# Patient Record
Sex: Male | Born: 2000
Health system: Southern US, Community
[De-identification: ages and names within clinical notes are randomized; demographics above are authoritative.]

## PROBLEM LIST (undated history)

## (undated) ENCOUNTER — Emergency Department (HOSPITAL_COMMUNITY): Admission: EM | Payer: Medicaid Other | Source: Home / Self Care

## (undated) DIAGNOSIS — J45909 Unspecified asthma, uncomplicated: Secondary | ICD-10-CM

## (undated) DIAGNOSIS — K047 Periapical abscess without sinus: Secondary | ICD-10-CM

## (undated) DIAGNOSIS — F909 Attention-deficit hyperactivity disorder, unspecified type: Secondary | ICD-10-CM

## (undated) DIAGNOSIS — T7840XA Allergy, unspecified, initial encounter: Secondary | ICD-10-CM

## (undated) HISTORY — DX: Periapical abscess without sinus: K04.7

## (undated) HISTORY — DX: Allergy, unspecified, initial encounter: T78.40XA

## (undated) HISTORY — PX: MOUTH SURGERY: SHX715

## (undated) HISTORY — DX: Unspecified asthma, uncomplicated: J45.909

## (undated) HISTORY — DX: Attention-deficit hyperactivity disorder, unspecified type: F90.9

---
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
## 2023-11-14 DIAGNOSIS — Z113 Encounter for screening for infections with a predominantly sexual mode of transmission: Secondary | ICD-10-CM | POA: Diagnosis not present

## 2023-11-19 ENCOUNTER — Telehealth: Payer: Self-pay | Admitting: Medical

## 2023-11-19 ENCOUNTER — Ambulatory Visit (INDEPENDENT_AMBULATORY_CARE_PROVIDER_SITE_OTHER): Payer: BC Managed Care – PPO | Admitting: Medical

## 2023-11-19 VITALS — BP 130/80 | HR 94 | Ht 73.0 in | Wt 179.2 lb

## 2023-11-19 DIAGNOSIS — F909 Attention-deficit hyperactivity disorder, unspecified type: Secondary | ICD-10-CM | POA: Diagnosis not present

## 2023-11-19 DIAGNOSIS — Z202 Contact with and (suspected) exposure to infections with a predominantly sexual mode of transmission: Secondary | ICD-10-CM

## 2023-11-19 DIAGNOSIS — R21 Rash and other nonspecific skin eruption: Secondary | ICD-10-CM

## 2023-11-19 DIAGNOSIS — R369 Urethral discharge, unspecified: Secondary | ICD-10-CM | POA: Diagnosis not present

## 2023-11-19 LAB — POCT URINALYSIS DIP (PROADVANTAGE DEVICE)
Bilirubin, UA: NEGATIVE
Blood, UA: NEGATIVE
Glucose, UA: NEGATIVE mg/dL
Leukocytes, UA: NEGATIVE
Nitrite, UA: NEGATIVE
Protein Ur, POC: NEGATIVE mg/dL
Specific Gravity, Urine: 1.02
Urobilinogen, Ur: NEGATIVE
pH, UA: 7 (ref 5.0–8.0)

## 2023-11-19 MED ORDER — VALACYCLOVIR HCL 1 G PO TABS: 1000.0000 mg | ORAL_TABLET | Freq: Two times a day (BID) | ORAL | 0 refills | Status: AC

## 2023-11-19 MED ORDER — CEFTRIAXONE SODIUM 500 MG IJ SOLR
500.0000 mg | Freq: Once | INTRAMUSCULAR | Status: AC
  Administered 2023-11-19: 500 mg via INTRAMUSCULAR

## 2023-11-19 MED ORDER — DEXTROAMPHETAMINE SULFATE 10 MG PO TABS: 10.0000 mg | ORAL_TABLET | Freq: Every day | ORAL | 0 refills | Status: AC

## 2023-11-19 MED ORDER — AZITHROMYCIN 500 MG PO TABS: 1000.0000 mg | ORAL_TABLET | Freq: Once | ORAL | 0 refills | Status: AC

## 2023-11-19 NOTE — Telephone Encounter (Signed)
Mom left message asking for refill albuterol inhaler to [facility]

## 2023-11-19 NOTE — Progress Notes (Signed)
Subjective:  Mark Solis is a 22 y.o. male who presents for Chief Complaint  Patient presents with   STD check    STD check- has bumps on shaft area. Painful. Last Encourse was Tuesday. Partner did STI screening and was negative Went to school clinic in [place] on Tuesday and was told his urine and discharge was too thick to run a test. Did a herpes swab but not sure of outcome but gotten worse since Tuesday    Medication Refill    Needs a refill on his ADHD medication too.      Here for concern for STD.  He has symptoms that started last week.  Last sexual encounter was a week ago.  He has had penile discharge, greenish at times, he has had bumps on the penis, urine does not stay at times.  No urgency.  No burning with urination.  No testicle swelling or pain.  No fever body aches or chills.  His partner does not have any symptoms.  He does note history of chlamydia about a year ago.  He had HIV and other blood testing in about 2 months ago that was negative.  He went to clinic at the school a week ago and had testing but results have either not come back for therapy swab or the chlamydia test could not be run on the urine  Here to have swabs to check for gonorrhea and chlamydia.  The rash has been itchy.  They did swab the rash area on his penis shaft for herpes.  He has not got the results back yet.  He would like a refill on his ADHD medication.   He has been using his medicine periodically not every day.  His attention has been a little distracted of late but he uses his medicine more frequently with testing.  even lately playing basketball he gets distracted fairly easily.  No other aggravating or relieving factors.    No other c/o.  Past Medical History:  Diagnosis Date   ADHD    Allergy    Asthma    Dental abscess 2010   Current Outpatient Medications on File Prior to Visit  Medication Sig Dispense Refill   albuterol (VENTOLIN HFA) 108 (90 Base) MCG/ACT  inhaler Inhale 2 puffs into the lungs every 6 (six) hours as needed for wheezing or shortness of breath. 18 g 2   dextroamphetamine (DEXTROSTAT) 10 MG tablet Take 1 tablet (10 mg total) by mouth daily. 90 tablet 0   No current facility-administered medications on file prior to visit.     The following portions of the patient's history were reviewed and updated as appropriate: allergies, current medications, past family history, past medical history, past social history, past surgical history and problem list.  ROS Otherwise as in subjective above     Objective: BP 130/80   Pulse 94   Ht 6\' 1"  (1.854 m)   Wt 179 lb 3.2 oz (81.3 kg)   BMI 23.64 kg/m   General appearance: alert, no distress, well developed, well nourished GU: There are several areas of ulceration on the dorsal and left lateral base of the penis, some seem to be pinkish-red with some serous oozing, there are a few scabbed areas as well.  There are a few vesicles in this area.  There are some smaller scabbed lesions on the distal right shaft of the penis.  Testicles nontender with no swelling.  There is inguinal lymphadenopathy present.  No obvious canker sore.  There is a whitish clear discharge coming out of the head of the penis Psych: Pleasant, answers questions appropriately   Assessment: Encounter Diagnoses  Name Primary?   Penile discharge Yes   Venereal disease contact    Penile rash    Attention deficit hyperactivity disorder (ADHD), unspecified ADHD type      Plan: Penile discharge, venereal disease contact, penile rash:  Given your symptoms and exam findings today I recommend treatment empirically with the likelihood that there is an STD.  We gave you a shot of Rocephin 500 mg today to cover for gonorrhea  Begin azithromycin 2 tablets today to help treat chlamydia  I also sent Valtrex to your pharmacy.  Begin 1 tablet twice a day for a week for the potential for herpes.  Check back in with your  school clinic to find out the results of the herpes swab  We will report back: Your results are in from today.  Avoid sexual activity until this is all healed up.  I recommend no sexual activity for the next 2 weeks.  Use condoms.  I do recommend general STD repeat testing in the next 1 to 3 months   ADHD -continue dextroamphetamine 10 mg daily.  Advised to use it more regularly instead of sporadically since he is having more problems with distraction and inattention lately   [name] was seen today for std check and medication refill.  Diagnoses and all orders for this visit:  Penile discharge -     Chlamydia/Gonococcus/Trichomonas, NAA -     RPR -     POCT Urinalysis DIP (Proadvantage Device) -     cefTRIAXone (ROCEPHIN) injection 500 mg  Venereal disease contact -     Chlamydia/Gonococcus/Trichomonas, NAA -     RPR -     POCT Urinalysis DIP (Proadvantage Device) -     cefTRIAXone (ROCEPHIN) injection 500 mg  Penile rash -     Chlamydia/Gonococcus/Trichomonas, NAA -     RPR -     POCT Urinalysis DIP (Proadvantage Device) -     cefTRIAXone (ROCEPHIN) injection 500 mg  Attention deficit hyperactivity disorder (ADHD), unspecified ADHD type  Other orders -     azithromycin (ZITHROMAX) 500 MG tablet; Take 2 tablets (1,000 mg total) by mouth once for 1 dose. -     valACYclovir (VALTREX) 1000 MG tablet; Take 1 tablet (1,000 mg total) by mouth 2 (two) times daily for 7 days. -     dextroamphetamine (DEXTROSTAT) 10 MG tablet; Take 1 tablet (10 mg total) by mouth daily.   Follow up: pending labs

## 2023-11-19 NOTE — Patient Instructions (Signed)
Given your symptoms and exam findings today I recommend treatment empirically with the likelihood that there is an STD.  We gave you a shot of Rocephin 500 mg today to cover for gonorrhea  Begin azithromycin 2 tablets today to help treat chlamydia  I also sent Valtrex to your pharmacy.  Begin 1 tablet twice a day for a week for the potential for herpes.  Check back in with your school clinic to find out the results of the herpes swab  We will report back: Your results are in from today.  Avoid sexual activity until this is all healed up.  I recommend no sexual activity for the next 2 weeks.  Use condoms.  I do recommend general STD repeat testing in the next 1 to 3 months    Genital Herpes Genital herpes is a common sexually transmitted infection (STI) that is caused by a virus. The virus spreads from person to person through contact with a sore, infected saliva, or infected skin. The virus can cause itching, blisters, and sores around the genitals or rectum. During an outbreak of infection, symptoms may last for several days and then go away. However, the virus remains in the body, so more outbreaks may happen in the future. The time between outbreaks varies and can be from months to years. Genital herpes can affect anyone. It is particularly concerning for pregnant women because the virus can be passed to the baby during delivery. Genital herpes is also a concern for people who have a weak disease-fighting system (immune system). What are the causes? This condition is caused by the herpes simplex virus, type 1 or type 2 (HSV-1 or HSV-2). The virus may spread through: Sexual contact with an infected person, including vaginal, anal, and oral sex. Contact with a herpes sore. The skin. This means that you can get herpes from an infected partner even if there are no blisters or sores present. Your partner may not know that he or she is infected. What increases the risk? You are more likely to  develop this condition if: You have sex with many partners. You do not use latex or polyurethane condoms during sex. What are the signs or symptoms? Most people do not have symptoms or they have mild symptoms that may be mistaken for other skin problems. Symptoms may include: Small, red bumps near the genitals, rectum, or mouth. These bumps turn into blisters and then sores. Flu-like (influenza-like) symptoms, including: Fever. Body aches. Swollen lymph nodes. Headache. Painful urination. Pain and itching in the genital area or rectal area. Vaginal discharge. Tingling or shooting pain in the legs and buttocks. Generally, symptoms are more severe and last longer during the first (primary) outbreak. Influenza-like symptoms are also more common during the primary outbreak. How is this diagnosed? This condition may be diagnosed based on: A physical exam. Your medical history. Blood tests. A test of a fluid sample (culture) from an open sore. How is this treated? There is no cure for this condition, but treatment with antiviral medicines can do the following: Speed up healing and relieve symptoms. Help to reduce the spread of the virus to sexual partners. Limit the chance of future outbreaks, or make future outbreaks shorter. Lessen symptoms of future outbreaks. Your health care provider may also recommend over-the-counter medicines to help with pain and itching. Follow these instructions at home: If you have an outbreak:  Keep the affected areas dry and clean. Avoid rubbing or touching blisters and sores. If you do touch  blisters or sores: Wash your hands thoroughly with soap and water for at least 20 seconds. If soap and water are not available, use an alcohol-based hand sanitizer. Do not touch your eyes afterward. Sexual activity Do not have sexual contact during active outbreaks. Practice safe sex. Herpes can spread even if your partner does not have blisters or sores. Latex or  polyurethane condoms and male condoms may help prevent the spread of the herpes virus. Managing pain and discomfort If directed, put ice on the painful area. To do this: Put ice in a plastic bag. Place a towel between your skin and the bag. Leave the ice on for 20 minutes, 2-3 times a day. Remove the ice if your skin turns bright red. This is very important. If you cannot feel pain, heat, or cold, you have a greater risk of damage to the area. If told, take a cool sitz bath to help relieve pain or itching. A sitz bath is a water bath that you take while sitting down in water that is deep enough to cover your hips and buttocks. General instructions Take over-the-counter and prescription medicines only as told by your health care provider. If you were prescribed an antiviral medicine, use it as told by your health care provider. Do not stop using the antiviral even if you start to feel better. Keep all follow-up visits. This is important. How is this prevented? Use condoms. Although you can get genital herpes during sexual contact even with the use of a condom, a condom can provide some protection. Avoid having multiple sexual partners. Talk with your sexual partner about any symptoms either of you may have. Also, talk with your partner about any history of STIs. Do not have sexual contact if you have active symptoms of genital herpes. Contact a health care provider if: Your symptoms are not improving with medicine. Your symptoms return, or you have new symptoms. You have a fever. You have abdominal pain. You have redness, swelling, or pain in your eye. You notice new sores on other parts of your body. You have had herpes and you become pregnant or plan to become pregnant. Get help right away if: You have symptoms of viral meningitis. This is Gonorrhea Gonorrhea is a sexually transmitted infection (STI) that can infect any person. If left untreated, this infection can: Damage the  reproductive organs. Spread to other parts of the body. Cause someone to be unable to have children (infertility). Harm an unborn baby if an infected person is pregnant. It is important to get treatment for gonorrhea as soon as possible. All of your sex partners may also need to be treated for the infection. What are the causes? This condition is caused by bacteria called Neisseria gonorrhoeae. The infection is spread from person to person through sexual contact, including oral, anal, and vaginal sex. The infection can also pass from a pregnant person to the baby during birth. What increases the risk? The following factors may make you more likely to develop this condition: Being a woman younger than 25 years and sexually active. Being a man who has sex with men. Having a new sex partner or having multiple partners. Having a sex partner who has an STI. Not using condoms correctly or not using condoms every time you have sex. Having a history of STIs. What are the signs or symptoms? When symptoms occur, they may include: Abnormal discharge from the penis or vagina. The discharge may be cloudy, thick, or yellow-green in color. Pain  or burning when you urinate. Itching, irritation, pain, bleeding, or discharge from the rectum. This may occur if the infection was spread by anal sex. Sore throat or swollen lymph nodes in the neck. This may occur if the infection was spread by oral sex. Pain or swelling in the testicles. Bleeding between menstrual periods. If the infection has spread to other areas of the body, symptoms may include: Fever. Eye irritation. Swelling, redness, warmth, and pain in the joints. Rashes. In some cases, there are no symptoms. How is this diagnosed? This condition is diagnosed based on: A physical exam. A swab of fluid. The swab of fluid may be taken from the penis, vagina, throat, or rectum. Urine tests. Not all test results will be available during your  visit. How is this treated? This condition is treated with antibiotic medicines. It is important to start treatment as soon as possible. Early treatment may prevent some problems from developing. You should not have sex during treatment. All types of sexual activity should be avoided for at least 7 days after treatment is complete and until your sex partner or partners have been treated. Follow these instructions at home: Take over-the-counter and prescription medicines as told by your health care provider. Finish all antibiotic medicine even when you start to feel better. Do not have sex during treatment. Do not have sex until at least 7 days after you and your partner or partners have finished treatment and your health care provider says it is okay. It is up to you to get your test results. Ask your health care provider, or the department that is doing the test, when your results will be ready. If you get a positive result on your gonorrhea test, tell your recent sex partners. These include any partners for oral, anal, or vaginal sex. They need to be checked for gonorrhea even if they do not have symptoms. They may need treatment, even if they get negative results on their gonorrhea tests. Keep all follow-up visits. This is important. How is this prevented?  Use latex or polyurethane condoms correctly every time you have sex. Ask if your sex partner or partners have been tested for STIs and had negative results. Avoid having multiple sex partners. Get regular health screenings to check for STIs. Contact a health care provider if: Your symptoms do not get better after a few days of taking antibiotics. Your symptoms get worse. You cannot take your medicine as directed by your health care provider. You develop new symptoms, including: Eye irritation. Swelling, redness, warmth, and pain in the joints. Rashes. You have a fever. Summary Gonorrhea is a sexually transmitted infection (STI) that  can infect any person. This infection is spread from person to person through sexual contact, including oral, anal, and vaginal sex. The infection can also pass from a pregnant person to the baby during birth. Symptoms include abnormal discharge, pain or burning while urinating, or pain in the rectum. This condition is treated with antibiotic medicines. Do not have sex until at least 7 days after both you and any sex partners have completed antibiotic treatment. Tell your health care provider if you have trouble taking your medicine, your symptoms get worse, or you have new symptoms. Keep all follow-up visits. This information is not intended to replace advice given to you by your health care provider. Make sure you discuss any questions you have with your health care provider. Document Revised: 10/19/2021 Document Reviewed: 10/19/2021 Elsevier Patient Education  2024 [employer] rare  but may happen if the virus spreads to the brain. Symptoms may include: Severe headache or stiff neck. Muscle aches. Nausea and vomiting. Sensitivity to light. Summary Genital herpes is a common sexually transmitted infection (STI) that is caused by the herpes simplex virus, type 1 or type 2 (HSV-1 or HSV-2). These viruses are most often spread through sexual contact with an infected person. You are more likely to develop this condition if you have sex with many partners or you do not use condoms during sex. Most people do not have symptoms or have mild symptoms that may be mistaken for other skin problems. Symptoms occur as outbreaks that may happen months or years apart. There is no cure for this condition, but treatment with oral antiviral medicines can reduce symptoms, reduce the chance of spreading the virus to a partner, prevent future outbreaks, or shorten future outbreaks. This information is not intended to replace advice given to you by your health care provider. Make sure you discuss any questions you  have with your health care provider. Document Revised: 08/31/2021 Document Reviewed: 08/31/2021 Elsevier Patient Education  2024 [employer].

## 2023-11-20 ENCOUNTER — Telehealth: Payer: Self-pay

## 2023-11-20 ENCOUNTER — Other Ambulatory Visit (HOSPITAL_COMMUNITY): Payer: Self-pay

## 2023-11-20 ENCOUNTER — Other Ambulatory Visit: Payer: Self-pay | Admitting: Medical

## 2023-11-20 LAB — RPR: RPR Ser Ql: NONREACTIVE

## 2023-11-20 MED ORDER — ALBUTEROL SULFATE HFA 108 (90 BASE) MCG/ACT IN AERS: 2.0000 | INHALATION_SPRAY | Freq: Four times a day (QID) | RESPIRATORY_TRACT | 2 refills | Status: AC | PRN

## 2023-11-20 NOTE — Telephone Encounter (Signed)
done

## 2023-11-20 NOTE — Telephone Encounter (Signed)
Pharmacy Patient Advocate Encounter  Insurance verification completed.    The patient is insured through [employer] test claim for Dextroamphetamine. Currently a quantity of 30 is a 30 day supply and the co-pay is 29.61 .   This test claim was processed through [facility]- copay amounts may vary at other pharmacies due to pharmacy/plan contracts, or as the patient moves through the different stages of their insurance plan.

## 2023-11-20 NOTE — Progress Notes (Signed)
Syphilis test negative.  Still pending the other tests

## 2023-11-21 LAB — CHLAMYDIA/GONOCOCCUS/TRICHOMONAS, NAA
Chlamydia by NAA: NEGATIVE
Gonococcus by NAA: NEGATIVE
Trich vag by NAA: NEGATIVE

## 2023-11-21 NOTE — Progress Notes (Signed)
Surprisingly the results showed negative for gonorrhea and chlamydia and trichomonas  There is something called nongonococcal urethritis that could be going on.  Did you hear back from the other clinic about the herpes swab?  What was the result?  What are your symptoms currently?

## 2023-11-22 NOTE — Progress Notes (Signed)
Finish out the Valtrex.  Call the clinic back today and ask about the herpes result and then let us know as well.  A primary herpes infection can give the rash and discharge so I am still wondering about that infection  At the very least call back Monday with update on symptoms after using the Valtrex and completing the other 2 medications

## 2023-12-25 ENCOUNTER — Other Ambulatory Visit (HOSPITAL_COMMUNITY): Payer: Self-pay

## 2023-12-25 ENCOUNTER — Telehealth: Payer: Self-pay

## 2023-12-25 NOTE — Telephone Encounter (Signed)
 Pharmacy Patient Advocate Encounter   Received notification from CoverMyMeds that prior authorization for Dextroamphetamine  Sulfate 10MG  tablets is required/requested.   Insurance verification completed.   The patient is insured through [facility] .   Per test claim: PA required; PA submitted to above mentioned insurance via CoverMyMeds Key/confirmation #/EOC (Key: [id number])        Status is pending

## 2023-12-26 ENCOUNTER — Other Ambulatory Visit (HOSPITAL_COMMUNITY): Payer: Self-pay

## 2023-12-26 NOTE — Telephone Encounter (Signed)
Pharmacy Patient Advocate Encounter  Received notification from [facility] that Prior Authorization for Dextroamphetamine Sulfate 10MG  tabletshas been APPROVED from 1.15.25 to 1.15.28. Ran test claim, Copay is $RTS. RX IS PAYABLE AGAIN ON 03/06/24. This test claim was processed through [facility]- copay amounts may vary at other pharmacies due to pharmacy/plan contracts, or as the patient moves through the different stages of their insurance plan.    PA #/Case ID/Reference #: (Key: [id number])

## 2024-05-14 ENCOUNTER — Encounter: Admitting: Medical
